# Patient Record
Sex: Female | Born: 1950 | Race: White | Hispanic: No | Marital: Married
Health system: Southern US, Community
[De-identification: ages and names within clinical notes are randomized; demographics above are authoritative.]

---

## 2000-08-12 ENCOUNTER — Encounter: Payer: Self-pay | Admitting: Family Medicine

## 2000-08-12 ENCOUNTER — Encounter: Admission: RE | Admit: 2000-08-12 | Discharge: 2000-08-12 | Payer: Self-pay | Admitting: Family Medicine

## 2000-08-21 ENCOUNTER — Ambulatory Visit (HOSPITAL_COMMUNITY): Admission: RE | Admit: 2000-08-21 | Discharge: 2000-08-21 | Payer: Self-pay | Admitting: Family Medicine

## 2000-08-21 ENCOUNTER — Encounter: Payer: Self-pay | Admitting: Family Medicine

## 2001-04-12 ENCOUNTER — Other Ambulatory Visit: Admission: RE | Admit: 2001-04-12 | Discharge: 2001-04-12 | Payer: Self-pay | Admitting: Gynecology

## 2002-04-18 ENCOUNTER — Other Ambulatory Visit: Admission: RE | Admit: 2002-04-18 | Discharge: 2002-04-18 | Payer: Self-pay | Admitting: Gynecology

## 2003-04-25 ENCOUNTER — Other Ambulatory Visit: Admission: RE | Admit: 2003-04-25 | Discharge: 2003-04-25 | Payer: Self-pay | Admitting: Gynecology

## 2004-01-08 ENCOUNTER — Ambulatory Visit (HOSPITAL_COMMUNITY): Admission: RE | Admit: 2004-01-08 | Discharge: 2004-01-08 | Payer: Self-pay | Admitting: Gynecology

## 2004-01-08 ENCOUNTER — Ambulatory Visit (HOSPITAL_BASED_OUTPATIENT_CLINIC_OR_DEPARTMENT_OTHER): Admission: RE | Admit: 2004-01-08 | Discharge: 2004-01-08 | Payer: Self-pay | Admitting: Gynecology

## 2004-05-22 ENCOUNTER — Other Ambulatory Visit: Admission: RE | Admit: 2004-05-22 | Discharge: 2004-05-22 | Payer: Self-pay | Admitting: Gynecology

## 2005-06-08 ENCOUNTER — Other Ambulatory Visit: Admission: RE | Admit: 2005-06-08 | Discharge: 2005-06-08 | Payer: Self-pay | Admitting: Gynecology

## 2005-06-30 ENCOUNTER — Encounter: Admission: RE | Admit: 2005-06-30 | Discharge: 2005-06-30 | Payer: Self-pay | Admitting: Family Medicine

## 2005-09-14 ENCOUNTER — Inpatient Hospital Stay (HOSPITAL_COMMUNITY): Admission: AD | Admit: 2005-09-14 | Discharge: 2005-09-15 | Payer: Self-pay | Admitting: Gynecology

## 2006-09-27 ENCOUNTER — Other Ambulatory Visit: Admission: RE | Admit: 2006-09-27 | Discharge: 2006-09-27 | Payer: Self-pay | Admitting: Gynecology

## 2011-02-26 ENCOUNTER — Other Ambulatory Visit: Payer: Self-pay | Admitting: Family Medicine

## 2011-02-26 DIAGNOSIS — R109 Unspecified abdominal pain: Secondary | ICD-10-CM

## 2011-02-27 ENCOUNTER — Other Ambulatory Visit: Payer: Self-pay

## 2011-02-27 ENCOUNTER — Ambulatory Visit
Admission: RE | Admit: 2011-02-27 | Discharge: 2011-02-27 | Disposition: A | Discharge status: Home / Self Care | Payer: BC Managed Care – PPO | Source: Ambulatory Visit | Attending: Family Medicine | Admitting: Family Medicine

## 2011-02-27 DIAGNOSIS — R109 Unspecified abdominal pain: Secondary | ICD-10-CM

## 2011-02-27 MED ORDER — IOHEXOL 300 MG/ML  SOLN: 100.0000 mL | Freq: Once | INTRAMUSCULAR | Status: DC | PRN

## 2011-02-27 MED ORDER — IOHEXOL 300 MG/ML  SOLN
100.0000 mL | Freq: Once | INTRAMUSCULAR | Status: AC | PRN
  Administered 2011-02-27: 100 mL via INTRAVENOUS

## 2011-08-13 ENCOUNTER — Other Ambulatory Visit: Payer: Self-pay | Admitting: Gynecology

## 2012-10-27 ENCOUNTER — Other Ambulatory Visit: Payer: Self-pay | Admitting: Gynecology

## 2014-09-26 ENCOUNTER — Other Ambulatory Visit: Payer: Self-pay | Admitting: Neurosurgery

## 2014-09-26 DIAGNOSIS — M5414 Radiculopathy, thoracic region: Secondary | ICD-10-CM

## 2014-10-26 ENCOUNTER — Ambulatory Visit
Admission: RE | Admit: 2014-10-26 | Discharge: 2014-10-26 | Disposition: A | Discharge status: Home / Self Care | Payer: BLUE CROSS/BLUE SHIELD | Source: Ambulatory Visit | Attending: Neurosurgery | Admitting: Neurosurgery

## 2014-10-26 DIAGNOSIS — M5414 Radiculopathy, thoracic region: Secondary | ICD-10-CM

## 2014-10-26 MED ORDER — MEPERIDINE HCL 100 MG/ML IJ SOLN
75.0000 mg | Freq: Once | INTRAMUSCULAR | Status: AC
  Administered 2014-10-26: 75 mg via INTRAMUSCULAR

## 2014-10-26 MED ORDER — ONDANSETRON HCL 4 MG/2ML IJ SOLN
4.0000 mg | Freq: Once | INTRAMUSCULAR | Status: AC
  Administered 2014-10-26: 4 mg via INTRAMUSCULAR

## 2014-10-26 MED ORDER — IOHEXOL 300 MG/ML  SOLN
10.0000 mL | Freq: Once | INTRAMUSCULAR | Status: AC | PRN
  Administered 2014-10-26: 10 mL via INTRATHECAL

## 2014-10-26 MED ORDER — ONDANSETRON HCL 4 MG/2ML IJ SOLN: 4.0000 mg | Freq: Four times a day (QID) | INTRAMUSCULAR | Status: DC | PRN

## 2014-10-26 MED ORDER — DIAZEPAM 5 MG PO TABS
5.0000 mg | ORAL_TABLET | Freq: Once | ORAL | Status: AC
  Administered 2014-10-26: 5 mg via ORAL

## 2014-10-26 NOTE — Discharge Instructions (Signed)

## 2015-12-23 DIAGNOSIS — R471 Dysarthria and anarthria: Secondary | ICD-10-CM | POA: Diagnosis not present

## 2015-12-23 DIAGNOSIS — L719 Rosacea, unspecified: Secondary | ICD-10-CM | POA: Diagnosis not present

## 2015-12-23 DIAGNOSIS — K219 Gastro-esophageal reflux disease without esophagitis: Secondary | ICD-10-CM | POA: Diagnosis not present

## 2015-12-23 DIAGNOSIS — L570 Actinic keratosis: Secondary | ICD-10-CM | POA: Diagnosis not present

## 2015-12-27 DIAGNOSIS — R1310 Dysphagia, unspecified: Secondary | ICD-10-CM | POA: Diagnosis not present

## 2015-12-27 DIAGNOSIS — K219 Gastro-esophageal reflux disease without esophagitis: Secondary | ICD-10-CM | POA: Diagnosis not present

## 2016-01-01 DIAGNOSIS — D235 Other benign neoplasm of skin of trunk: Secondary | ICD-10-CM | POA: Diagnosis not present

## 2016-01-01 DIAGNOSIS — D1801 Hemangioma of skin and subcutaneous tissue: Secondary | ICD-10-CM | POA: Diagnosis not present

## 2016-01-01 DIAGNOSIS — L814 Other melanin hyperpigmentation: Secondary | ICD-10-CM | POA: Diagnosis not present

## 2016-01-01 DIAGNOSIS — L570 Actinic keratosis: Secondary | ICD-10-CM | POA: Diagnosis not present

## 2016-01-01 DIAGNOSIS — L821 Other seborrheic keratosis: Secondary | ICD-10-CM | POA: Diagnosis not present

## 2016-02-04 ENCOUNTER — Other Ambulatory Visit: Payer: Self-pay | Admitting: Otolaryngology

## 2016-02-04 DIAGNOSIS — R1314 Dysphagia, pharyngoesophageal phase: Secondary | ICD-10-CM

## 2016-02-10 ENCOUNTER — Ambulatory Visit
Admission: RE | Admit: 2016-02-10 | Discharge: 2016-02-10 | Disposition: A | Discharge status: Home / Self Care | Payer: BLUE CROSS/BLUE SHIELD | Source: Ambulatory Visit | Attending: Otolaryngology | Admitting: Otolaryngology

## 2016-02-10 DIAGNOSIS — R1314 Dysphagia, pharyngoesophageal phase: Secondary | ICD-10-CM | POA: Diagnosis not present

## 2016-02-18 DIAGNOSIS — K219 Gastro-esophageal reflux disease without esophagitis: Secondary | ICD-10-CM | POA: Diagnosis not present

## 2016-03-04 DIAGNOSIS — E7800 Pure hypercholesterolemia, unspecified: Secondary | ICD-10-CM | POA: Diagnosis not present

## 2016-03-04 DIAGNOSIS — K219 Gastro-esophageal reflux disease without esophagitis: Secondary | ICD-10-CM | POA: Diagnosis not present

## 2016-03-04 DIAGNOSIS — I10 Essential (primary) hypertension: Secondary | ICD-10-CM | POA: Diagnosis not present

## 2016-03-04 DIAGNOSIS — Z0000 Encounter for general adult medical examination without abnormal findings: Secondary | ICD-10-CM | POA: Diagnosis not present

## 2016-03-25 DIAGNOSIS — M47812 Spondylosis without myelopathy or radiculopathy, cervical region: Secondary | ICD-10-CM | POA: Diagnosis not present

## 2016-06-12 DIAGNOSIS — Z23 Encounter for immunization: Secondary | ICD-10-CM | POA: Diagnosis not present

## 2016-06-16 DIAGNOSIS — K219 Gastro-esophageal reflux disease without esophagitis: Secondary | ICD-10-CM | POA: Diagnosis not present

## 2016-07-12 DIAGNOSIS — K5792 Diverticulitis of intestine, part unspecified, without perforation or abscess without bleeding: Secondary | ICD-10-CM | POA: Diagnosis not present

## 2016-07-15 DIAGNOSIS — K5792 Diverticulitis of intestine, part unspecified, without perforation or abscess without bleeding: Secondary | ICD-10-CM | POA: Diagnosis not present

## 2016-07-15 DIAGNOSIS — M545 Low back pain: Secondary | ICD-10-CM | POA: Diagnosis not present

## 2016-07-20 DIAGNOSIS — R12 Heartburn: Secondary | ICD-10-CM | POA: Diagnosis not present

## 2016-07-20 DIAGNOSIS — J040 Acute laryngitis: Secondary | ICD-10-CM | POA: Diagnosis not present

## 2016-07-20 DIAGNOSIS — F458 Other somatoform disorders: Secondary | ICD-10-CM | POA: Diagnosis not present

## 2016-07-24 ENCOUNTER — Other Ambulatory Visit: Payer: Self-pay | Admitting: Gastroenterology

## 2016-07-24 ENCOUNTER — Other Ambulatory Visit: Payer: Self-pay | Admitting: Family Medicine

## 2016-07-24 DIAGNOSIS — R109 Unspecified abdominal pain: Secondary | ICD-10-CM

## 2016-07-24 DIAGNOSIS — R471 Dysarthria and anarthria: Secondary | ICD-10-CM

## 2016-07-24 DIAGNOSIS — K219 Gastro-esophageal reflux disease without esophagitis: Secondary | ICD-10-CM

## 2016-07-29 ENCOUNTER — Other Ambulatory Visit: Payer: BLUE CROSS/BLUE SHIELD

## 2016-08-03 ENCOUNTER — Ambulatory Visit
Admission: RE | Admit: 2016-08-03 | Discharge: 2016-08-03 | Disposition: A | Discharge status: Home / Self Care | Payer: BLUE CROSS/BLUE SHIELD | Source: Ambulatory Visit | Attending: Gastroenterology | Admitting: Gastroenterology

## 2016-08-03 DIAGNOSIS — K5730 Diverticulosis of large intestine without perforation or abscess without bleeding: Secondary | ICD-10-CM | POA: Diagnosis not present

## 2016-08-03 DIAGNOSIS — R109 Unspecified abdominal pain: Secondary | ICD-10-CM

## 2016-08-03 MED ORDER — IOPAMIDOL (ISOVUE-300) INJECTION 61%
100.0000 mL | Freq: Once | INTRAVENOUS | Status: AC | PRN
  Administered 2016-08-03: 100 mL via INTRAVENOUS

## 2016-08-06 ENCOUNTER — Ambulatory Visit
Admission: RE | Admit: 2016-08-06 | Discharge: 2016-08-06 | Disposition: A | Discharge status: Home / Self Care | Payer: BLUE CROSS/BLUE SHIELD | Source: Ambulatory Visit | Attending: Family Medicine | Admitting: Family Medicine

## 2016-08-06 DIAGNOSIS — R471 Dysarthria and anarthria: Secondary | ICD-10-CM

## 2016-08-06 DIAGNOSIS — K219 Gastro-esophageal reflux disease without esophagitis: Secondary | ICD-10-CM

## 2016-08-06 DIAGNOSIS — R1310 Dysphagia, unspecified: Secondary | ICD-10-CM | POA: Diagnosis not present

## 2016-08-10 DIAGNOSIS — K219 Gastro-esophageal reflux disease without esophagitis: Secondary | ICD-10-CM | POA: Diagnosis not present

## 2016-09-09 DIAGNOSIS — E7800 Pure hypercholesterolemia, unspecified: Secondary | ICD-10-CM | POA: Diagnosis not present

## 2016-09-09 DIAGNOSIS — K219 Gastro-esophageal reflux disease without esophagitis: Secondary | ICD-10-CM | POA: Diagnosis not present

## 2016-09-09 DIAGNOSIS — E041 Nontoxic single thyroid nodule: Secondary | ICD-10-CM | POA: Diagnosis not present

## 2016-09-09 DIAGNOSIS — I10 Essential (primary) hypertension: Secondary | ICD-10-CM | POA: Diagnosis not present

## 2016-09-30 DIAGNOSIS — Z1231 Encounter for screening mammogram for malignant neoplasm of breast: Secondary | ICD-10-CM | POA: Diagnosis not present

## 2017-02-04 DIAGNOSIS — Z1211 Encounter for screening for malignant neoplasm of colon: Secondary | ICD-10-CM | POA: Diagnosis not present

## 2017-02-04 DIAGNOSIS — Z01818 Encounter for other preprocedural examination: Secondary | ICD-10-CM | POA: Diagnosis not present

## 2017-03-18 DIAGNOSIS — D126 Benign neoplasm of colon, unspecified: Secondary | ICD-10-CM | POA: Diagnosis not present

## 2017-03-18 DIAGNOSIS — Z1211 Encounter for screening for malignant neoplasm of colon: Secondary | ICD-10-CM | POA: Diagnosis not present

## 2017-03-18 DIAGNOSIS — K5730 Diverticulosis of large intestine without perforation or abscess without bleeding: Secondary | ICD-10-CM | POA: Diagnosis not present

## 2017-03-19 DIAGNOSIS — Z1211 Encounter for screening for malignant neoplasm of colon: Secondary | ICD-10-CM | POA: Diagnosis not present

## 2017-03-19 DIAGNOSIS — D126 Benign neoplasm of colon, unspecified: Secondary | ICD-10-CM | POA: Diagnosis not present

## 2017-04-07 DIAGNOSIS — L918 Other hypertrophic disorders of the skin: Secondary | ICD-10-CM | POA: Diagnosis not present

## 2017-04-07 DIAGNOSIS — L570 Actinic keratosis: Secondary | ICD-10-CM | POA: Diagnosis not present

## 2017-04-07 DIAGNOSIS — D1801 Hemangioma of skin and subcutaneous tissue: Secondary | ICD-10-CM | POA: Diagnosis not present

## 2017-04-07 DIAGNOSIS — D225 Melanocytic nevi of trunk: Secondary | ICD-10-CM | POA: Diagnosis not present

## 2017-04-07 DIAGNOSIS — L821 Other seborrheic keratosis: Secondary | ICD-10-CM | POA: Diagnosis not present

## 2017-04-22 ENCOUNTER — Other Ambulatory Visit: Payer: Self-pay | Admitting: Family Medicine

## 2017-04-22 DIAGNOSIS — I10 Essential (primary) hypertension: Secondary | ICD-10-CM | POA: Diagnosis not present

## 2017-04-22 DIAGNOSIS — E041 Nontoxic single thyroid nodule: Secondary | ICD-10-CM

## 2017-04-22 DIAGNOSIS — Z0000 Encounter for general adult medical examination without abnormal findings: Secondary | ICD-10-CM | POA: Diagnosis not present

## 2017-04-22 DIAGNOSIS — Z23 Encounter for immunization: Secondary | ICD-10-CM | POA: Diagnosis not present

## 2017-04-26 ENCOUNTER — Ambulatory Visit
Admission: RE | Admit: 2017-04-26 | Discharge: 2017-04-26 | Disposition: A | Discharge status: Home / Self Care | Payer: BLUE CROSS/BLUE SHIELD | Source: Ambulatory Visit | Attending: Family Medicine | Admitting: Family Medicine

## 2017-04-26 DIAGNOSIS — E041 Nontoxic single thyroid nodule: Secondary | ICD-10-CM

## 2017-06-01 DIAGNOSIS — H2513 Age-related nuclear cataract, bilateral: Secondary | ICD-10-CM | POA: Diagnosis not present

## 2017-06-01 DIAGNOSIS — H524 Presbyopia: Secondary | ICD-10-CM | POA: Diagnosis not present

## 2017-06-23 DIAGNOSIS — Z23 Encounter for immunization: Secondary | ICD-10-CM | POA: Diagnosis not present

## 2017-09-01 ENCOUNTER — Other Ambulatory Visit: Payer: Self-pay | Admitting: Family Medicine

## 2017-09-01 DIAGNOSIS — E041 Nontoxic single thyroid nodule: Secondary | ICD-10-CM

## 2017-09-01 DIAGNOSIS — R1310 Dysphagia, unspecified: Secondary | ICD-10-CM | POA: Diagnosis not present

## 2017-09-03 ENCOUNTER — Ambulatory Visit
Admission: RE | Admit: 2017-09-03 | Discharge: 2017-09-03 | Disposition: A | Discharge status: Home / Self Care | Payer: BLUE CROSS/BLUE SHIELD | Source: Ambulatory Visit | Attending: Family Medicine | Admitting: Family Medicine

## 2017-09-03 DIAGNOSIS — E041 Nontoxic single thyroid nodule: Secondary | ICD-10-CM

## 2017-11-02 DIAGNOSIS — Z1231 Encounter for screening mammogram for malignant neoplasm of breast: Secondary | ICD-10-CM | POA: Diagnosis not present

## 2017-11-08 DIAGNOSIS — H04123 Dry eye syndrome of bilateral lacrimal glands: Secondary | ICD-10-CM | POA: Diagnosis not present

## 2017-11-18 DIAGNOSIS — K219 Gastro-esophageal reflux disease without esophagitis: Secondary | ICD-10-CM | POA: Diagnosis not present

## 2018-02-24 DIAGNOSIS — H04123 Dry eye syndrome of bilateral lacrimal glands: Secondary | ICD-10-CM | POA: Diagnosis not present

## 2018-03-16 DIAGNOSIS — M47816 Spondylosis without myelopathy or radiculopathy, lumbar region: Secondary | ICD-10-CM | POA: Diagnosis not present

## 2018-03-16 DIAGNOSIS — M25551 Pain in right hip: Secondary | ICD-10-CM | POA: Diagnosis not present

## 2018-03-16 DIAGNOSIS — M170 Bilateral primary osteoarthritis of knee: Secondary | ICD-10-CM | POA: Diagnosis not present

## 2018-03-16 DIAGNOSIS — M25552 Pain in left hip: Secondary | ICD-10-CM | POA: Diagnosis not present

## 2018-03-30 DIAGNOSIS — M170 Bilateral primary osteoarthritis of knee: Secondary | ICD-10-CM | POA: Diagnosis not present

## 2018-04-02 DIAGNOSIS — M545 Low back pain: Secondary | ICD-10-CM | POA: Diagnosis not present

## 2018-04-07 DIAGNOSIS — H04123 Dry eye syndrome of bilateral lacrimal glands: Secondary | ICD-10-CM | POA: Diagnosis not present

## 2018-04-28 DIAGNOSIS — M545 Low back pain: Secondary | ICD-10-CM | POA: Diagnosis not present

## 2018-06-22 DIAGNOSIS — E7800 Pure hypercholesterolemia, unspecified: Secondary | ICD-10-CM | POA: Diagnosis not present

## 2018-06-22 DIAGNOSIS — Z0000 Encounter for general adult medical examination without abnormal findings: Secondary | ICD-10-CM | POA: Diagnosis not present

## 2018-06-22 DIAGNOSIS — G479 Sleep disorder, unspecified: Secondary | ICD-10-CM | POA: Diagnosis not present

## 2018-06-22 DIAGNOSIS — Z23 Encounter for immunization: Secondary | ICD-10-CM | POA: Diagnosis not present

## 2018-06-22 DIAGNOSIS — I10 Essential (primary) hypertension: Secondary | ICD-10-CM | POA: Diagnosis not present

## 2018-06-22 DIAGNOSIS — K219 Gastro-esophageal reflux disease without esophagitis: Secondary | ICD-10-CM | POA: Diagnosis not present

## 2018-08-09 DIAGNOSIS — E875 Hyperkalemia: Secondary | ICD-10-CM | POA: Diagnosis not present

## 2018-08-28 IMAGING — CT CT ABD-PELV W/ CM
2 of 5 series · 16 of 46 positions shown, 18 images · IV contrast (APPLIED)
Comparison: 02/27/2011

CLINICAL DATA: Left side abdominal pain for 1 month.  Weight loss.

EXAM:
CT ABDOMEN AND PELVIS WITH CONTRAST
TECHNIQUE: Multidetector CT imaging of the abdomen and pelvis was performed
using the standard protocol following bolus administration of
intravenous contrast.
CONTRAST:  100mL PJRMWV-6TT IOPAMIDOL (PJRMWV-6TT) INJECTION 61%

[Series 2: abd/pelvis w/cm · axial · 0.69mm/px · z∈[+682,+1092]mm · 13 of 94 slices shown, 15 images]
[im 6/94  soft-tissue]
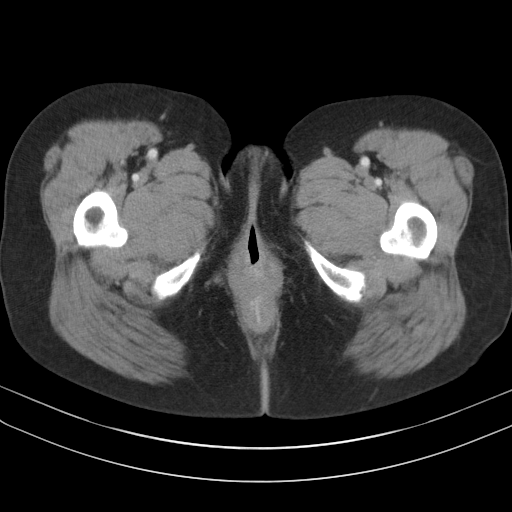
[im 6/94  bone]
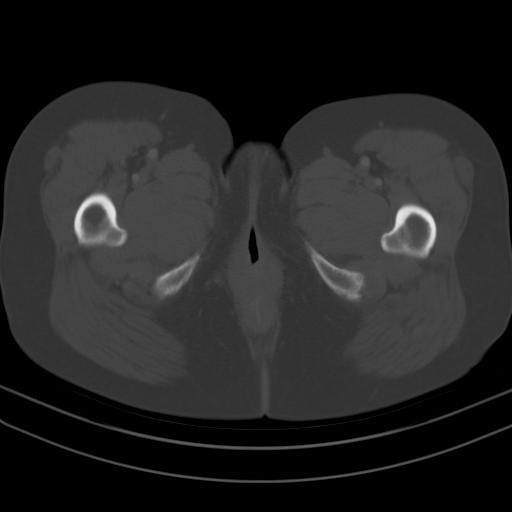
[im 11/94  soft-tissue]
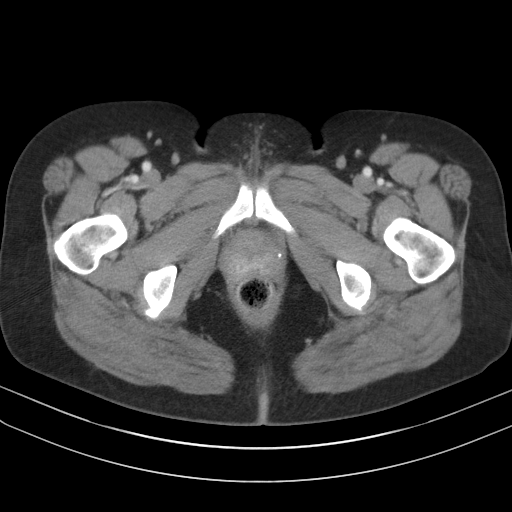
[im 21/94  soft-tissue]
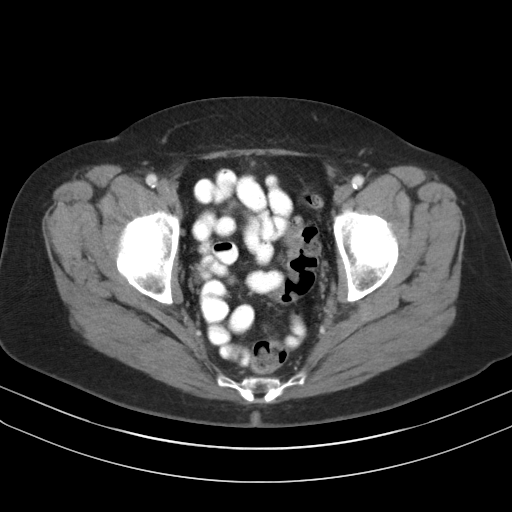
[im 26/94  soft-tissue]
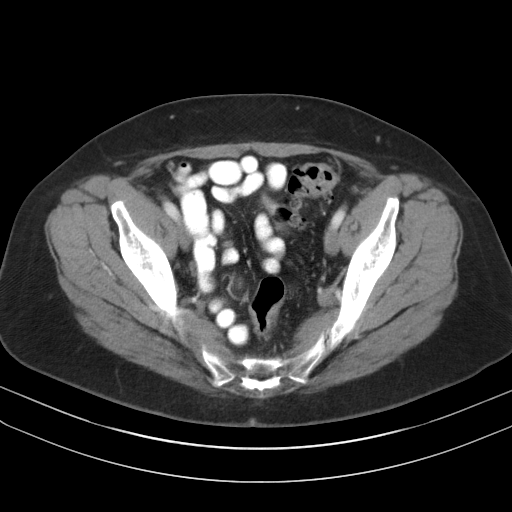
[im 32/94  soft-tissue]
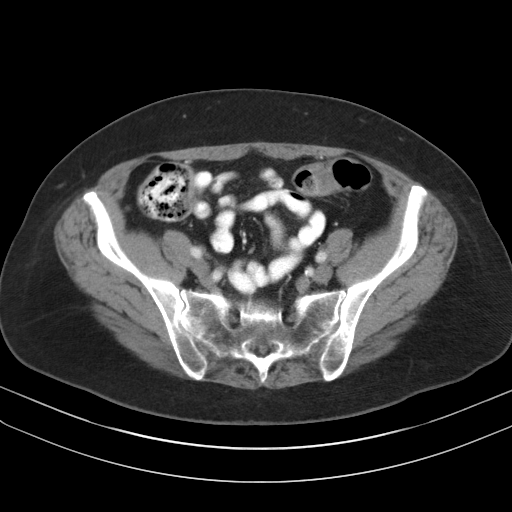
[im 42/94  soft-tissue]
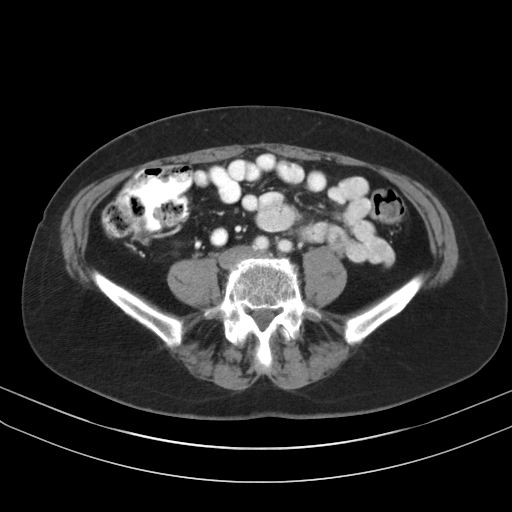
[im 47/94  soft-tissue]
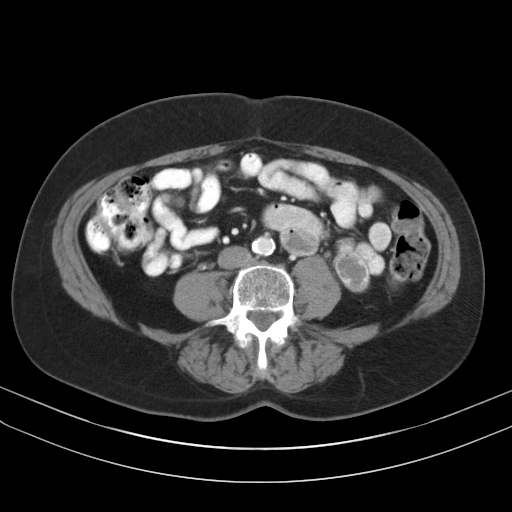
[im 52/94  soft-tissue]
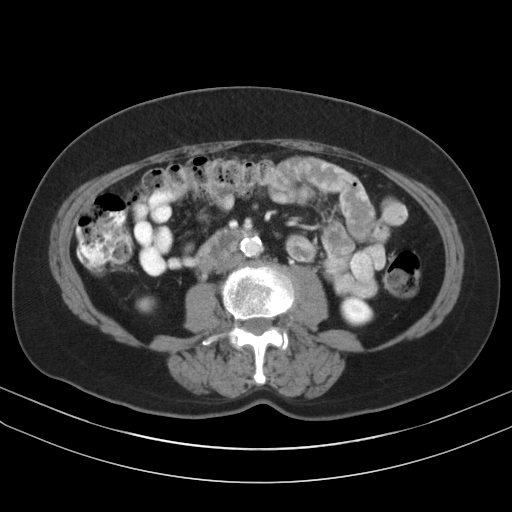
[im 63/94  soft-tissue]
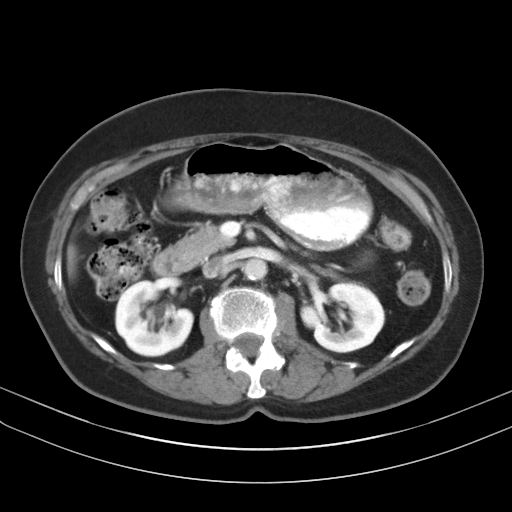
[im 63/94  bone]
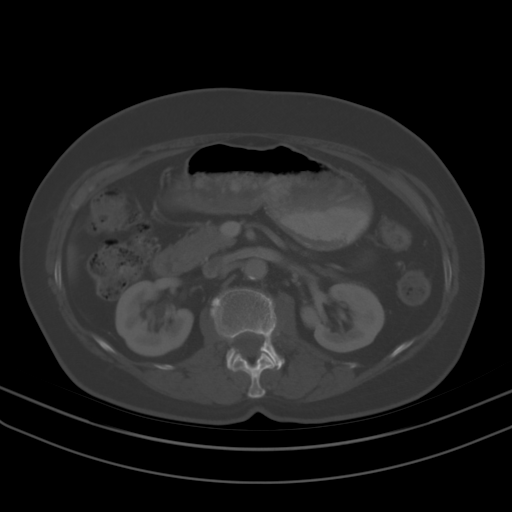
[im 68/94  soft-tissue]
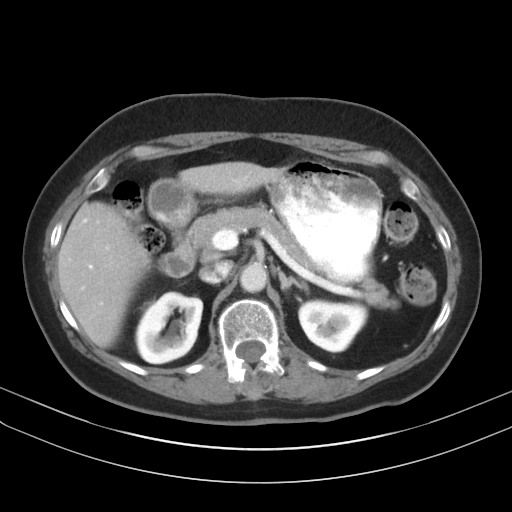
[im 73/94  soft-tissue]
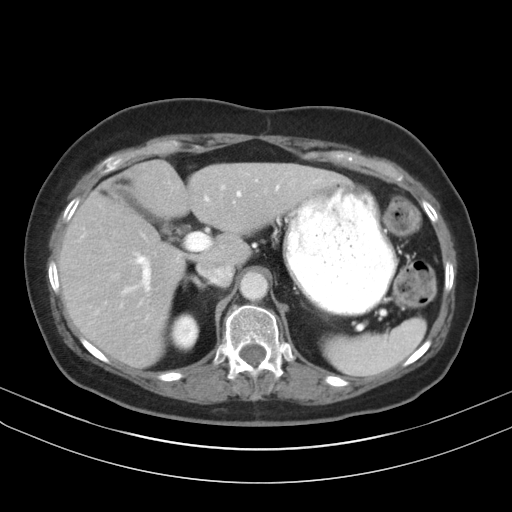
[im 83/94  soft-tissue]
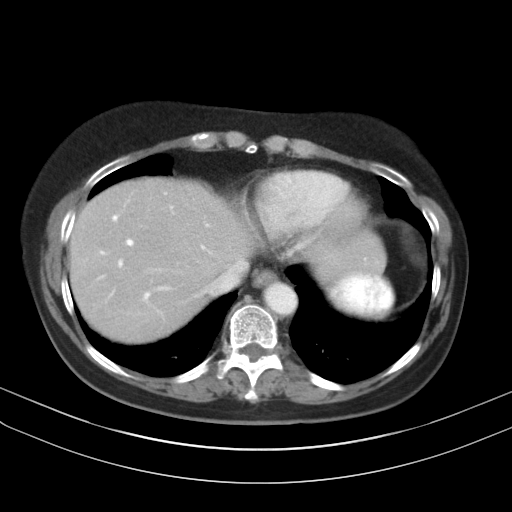
[im 88/94  soft-tissue]
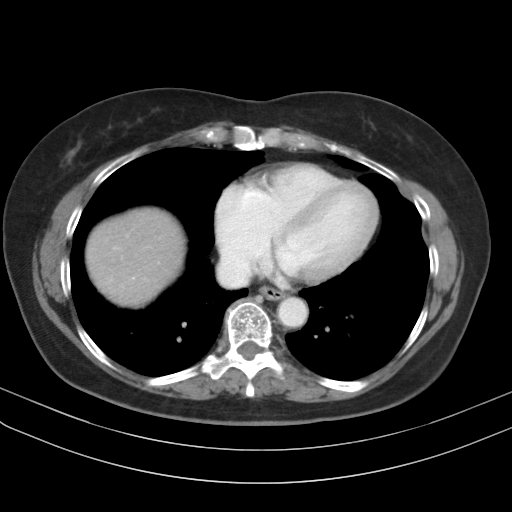

[Series 3: cor · coronal · 0.66mm/px · 3 of 76 slices shown]
[im 26/76  soft-tissue]
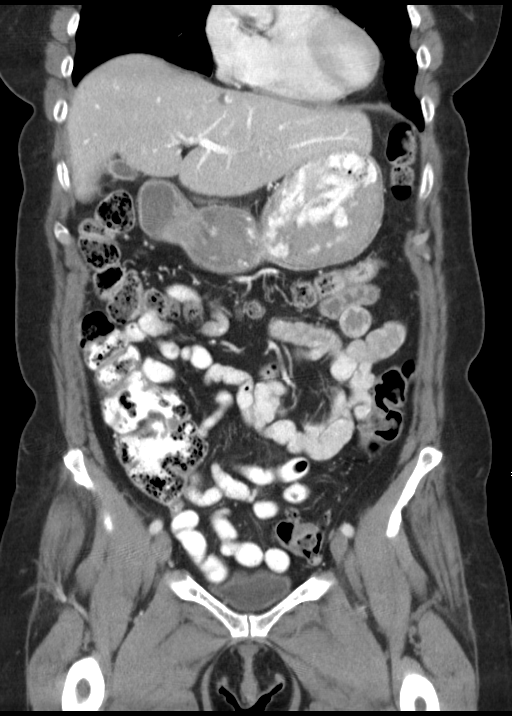
[im 34/76  soft-tissue]
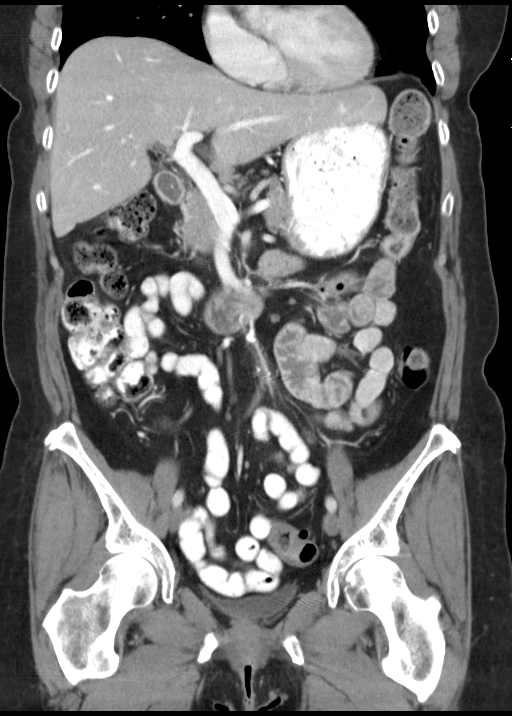
[im 42/76  soft-tissue]
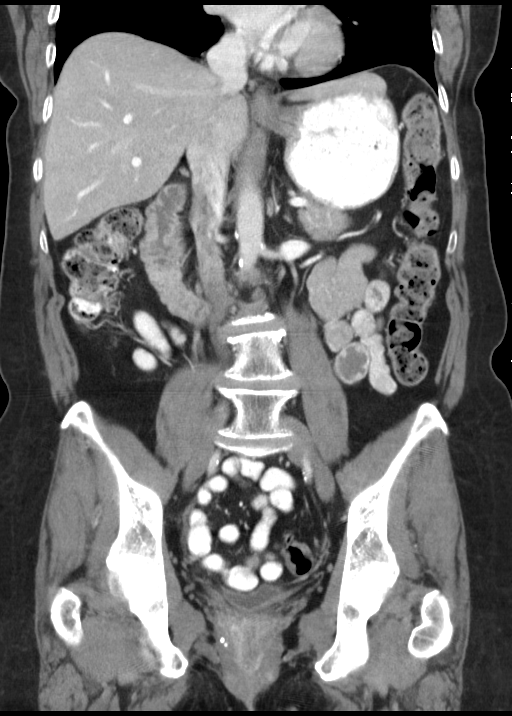

[16 of 46 positions shown; findings below may reference images not displayed]

FINDINGS: Lower chest: Areas of atelectasis or scarring in the lung bases.
Heart is normal size. No effusions.

Hepatobiliary: Probable hemangioma in the posterior right hepatic
lobe, approximately 1.7 cm, not significantly changed since prior
study. Small hypodensity in the left hepatic dome, likely small
cysts. No biliary duct dilatation. Gallbladder contracted, grossly
unremarkable.

Pancreas: No focal abnormality or ductal dilatation.

Spleen: No focal abnormality.  Normal size.

Adrenals/Urinary Tract: No adrenal abnormality. No focal renal
abnormality. No stones or hydronephrosis. Urinary bladder is
unremarkable.

Stomach/Bowel: Sigmoid diverticulosis. No active diverticulitis.
Stomach, large and small bowel grossly unremarkable.

Vascular/Lymphatic: Diffuse aortic calcifications and scattered
iliac calcifications. No aneurysm. There are small right lower
quadrant mesenteric lymph nodes, none pathologically enlarged. These
are stable since prior study. No adenopathy.

Reproductive: Prior hysterectomy.  No adnexal mass.

Other: No free fluid or free air.

Musculoskeletal: Degenerative disc disease in the lumbar spine. No
acute bony abnormality.
IMPRESSION: No acute findings in the abdomen or pelvis.

Sigmoid diverticulosis.

Stable hemangioma in the posterior right hepatic lobe.

Aortoiliac atherosclerosis.

## 2018-08-31 IMAGING — US US THYROID
1 series · 13 of 25 positions shown · non-contrast
Comparison: None.

CLINICAL DATA: Other.  Knot in throat.  Dysarthria.  Dysphagia.

EXAM:
THYROID ULTRASOUND
TECHNIQUE: Ultrasound examination of the thyroid gland and adjacent soft
tissues was performed.

[Series 1: us thyroid · 0.08mm/px · 13 of 53 slices shown]
[im 1/53]
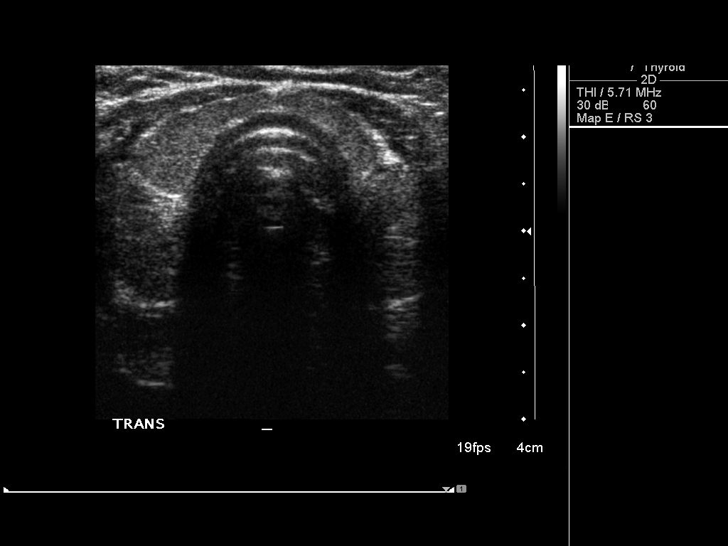
[im 5/53]
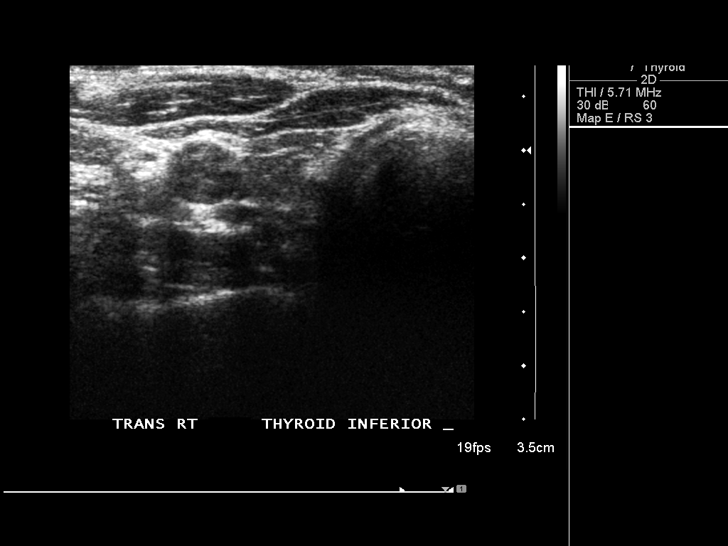
[im 9/53]
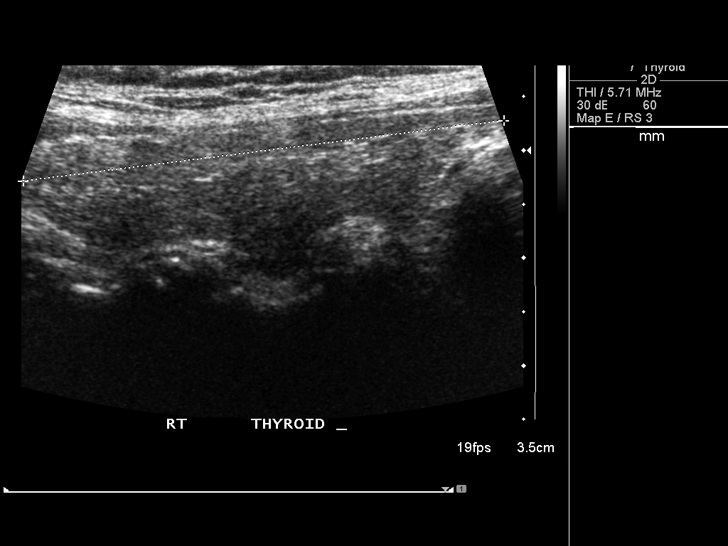
[im 14/53]
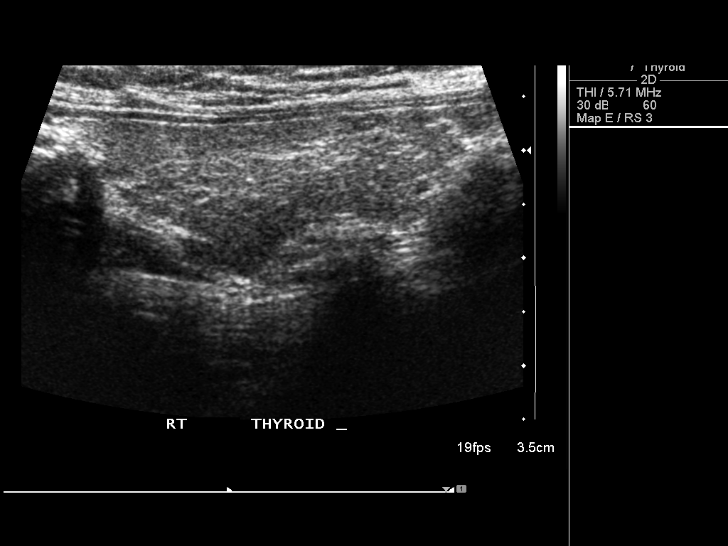
[im 18/53]
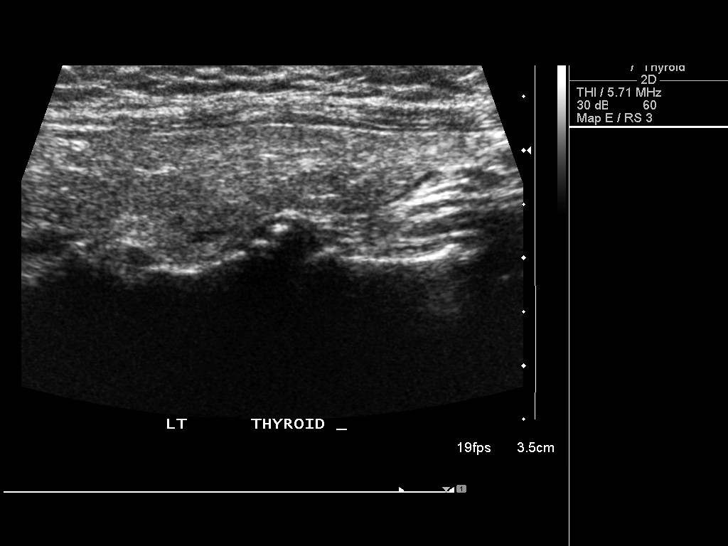
[im 22/53]
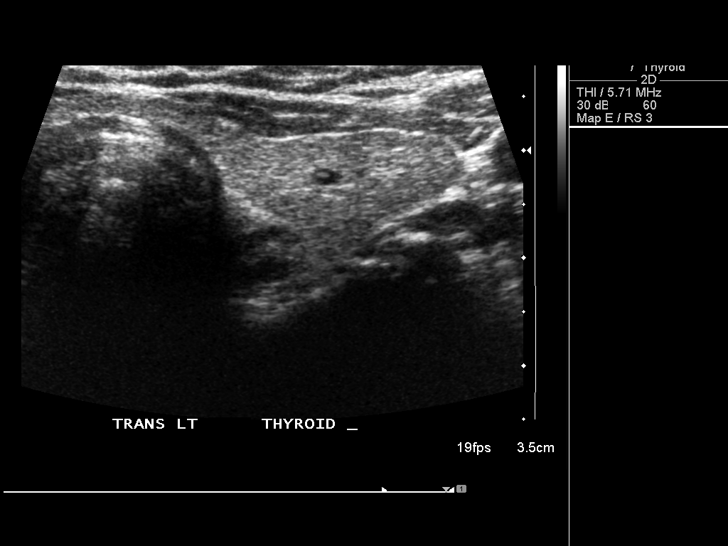
[im 27/53]
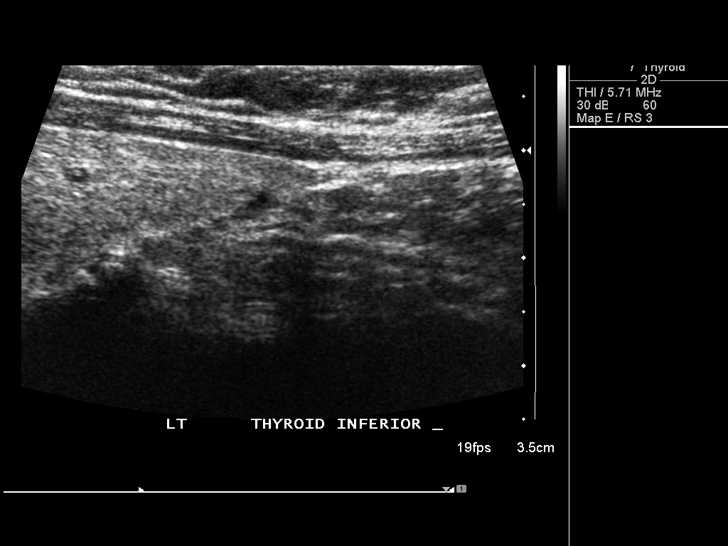
[im 31/53]
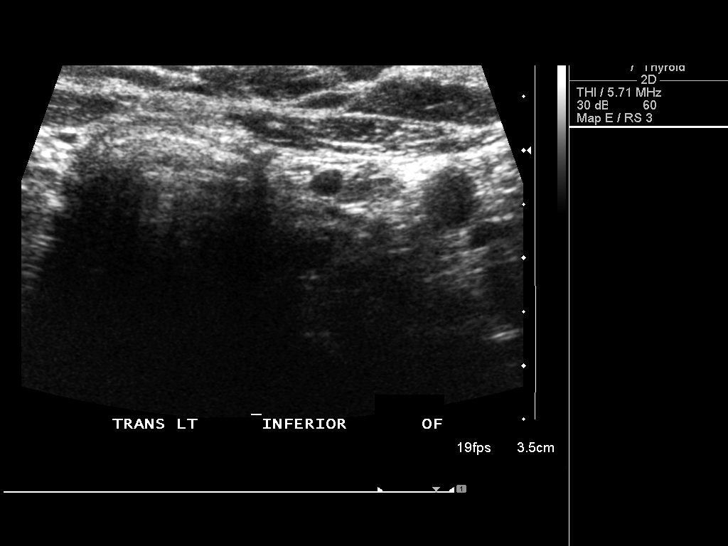
[im 35/53]
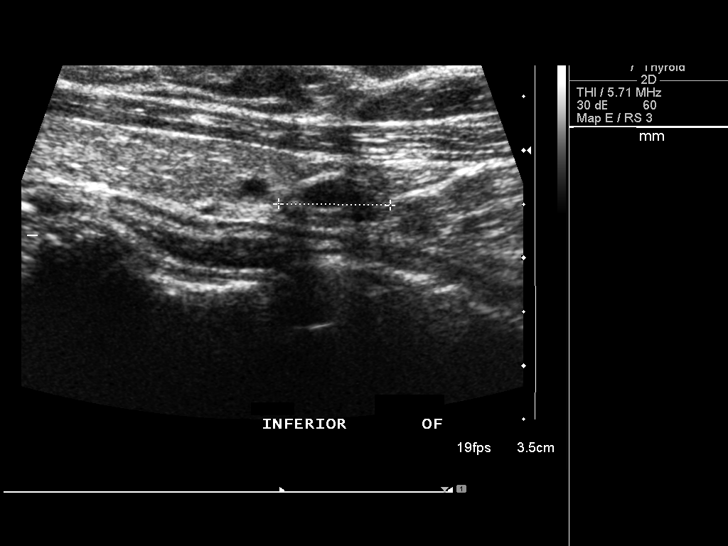
[im 40/53]
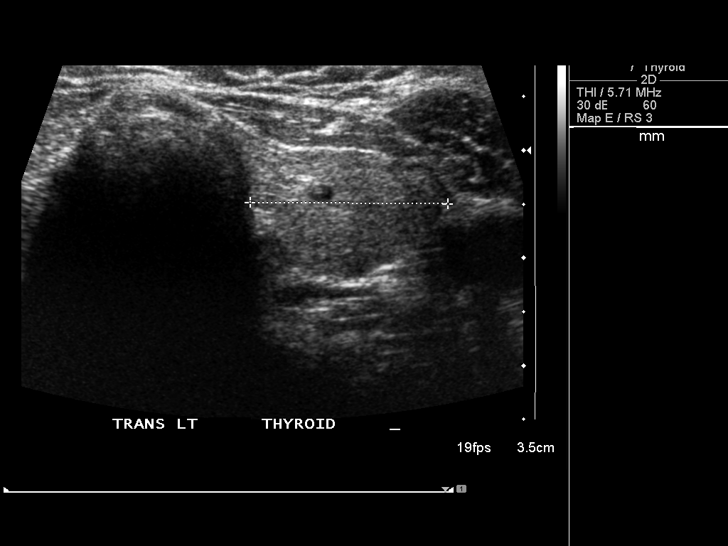
[im 44/53]
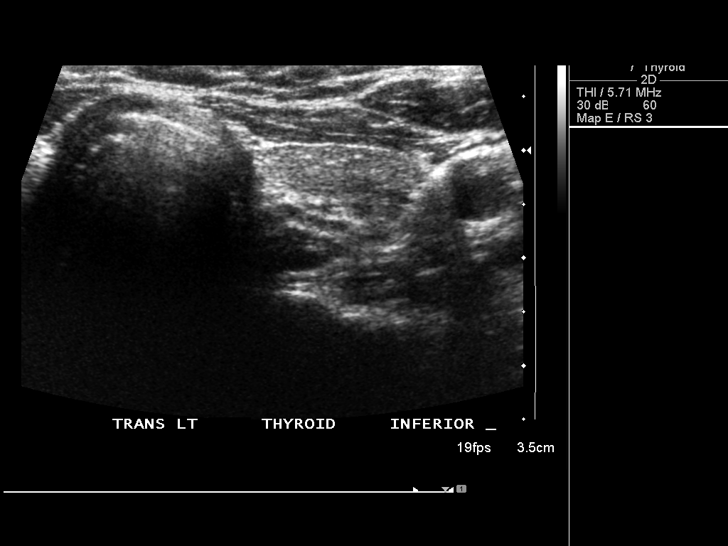
[im 48/53]
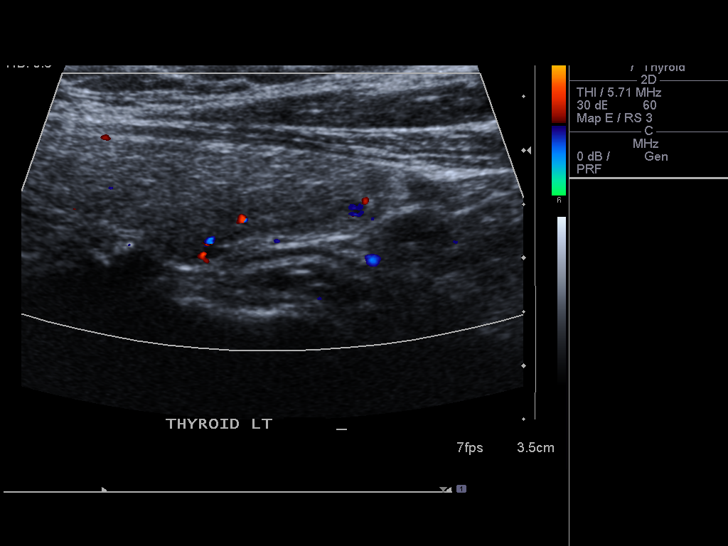
[im 53/53]
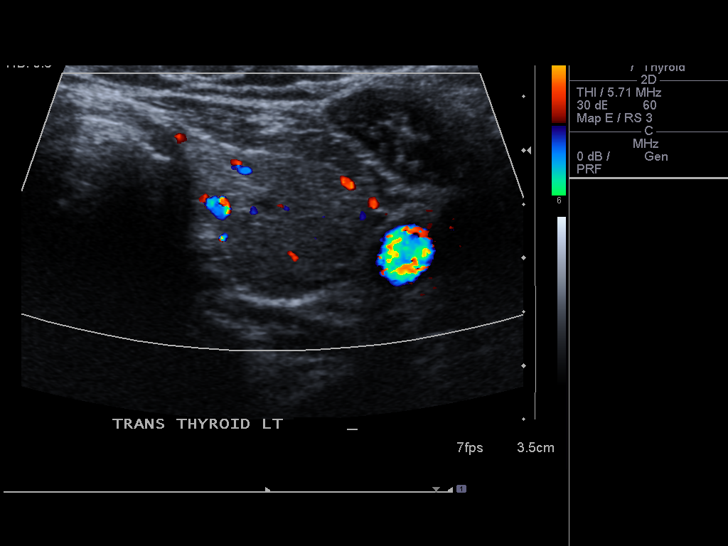

[13 of 25 positions shown; findings below may reference images not displayed]

FINDINGS: Parenchymal Echotexture: Moderately heterogenous

Estimated total number of nodules >/= 1 cm: 0

Number of spongiform nodules >/=  2 cm not described below (TR1): 0

Number of mixed cystic and solid nodules >/= 1.5 cm not described
below (TR2): 0

_________________________________________________________

Isthmus: Normal in size measures 0.3 cm in diameter

No discrete nodules are identified within the thyroid isthmus.

_________________________________________________________

Right lobe: Normal in size measuring 4.5 x 1.4 x 1.6 cm

No discrete nodules are identified within the right lobe of the
thyroid.

_________________________________________________________

Left lobe: Normal in size measuring 4.6 x 1.5 x 2.1 cm

There is a punctate (approximately 0.3 cm) anechoic cyst which
contains a punctate internal echogenic foci compatible with colloid
within the left lobe of the thyroid which is not meet imaging
criteria to recommend percutaneous sampling or dedicated follow-up.

The patient's palpable area of concern within the left side of the
neck correlates with an approximately 0.8 x 0.4 x 1.0 cm mixed
echogenic, partially cystic, partially solid nodule which appears to
be either adjacent to or arise exophytically from the inferior
aspect the left lobe of the thyroid.
IMPRESSION: Patient's palpable area of concern within the left side of the neck
appears to correlate with a punctate (approximately 1 cm) partially
cystic, partially solid nodule which appears to be either adjacent
to or arise exophytically from the inferior aspect of the left lobe
of the thyroid and does not meet imaging criteria to recommend
percutaneous sampling or dedicated follow-up.

The above is in keeping with the ACR TI-RADS recommendations - [HOSPITAL] 9488;[DATE].

## 2018-10-10 DIAGNOSIS — H04123 Dry eye syndrome of bilateral lacrimal glands: Secondary | ICD-10-CM | POA: Diagnosis not present

## 2018-10-10 DIAGNOSIS — H5203 Hypermetropia, bilateral: Secondary | ICD-10-CM | POA: Diagnosis not present

## 2018-10-10 DIAGNOSIS — H2513 Age-related nuclear cataract, bilateral: Secondary | ICD-10-CM | POA: Diagnosis not present

## 2018-10-12 DIAGNOSIS — D229 Melanocytic nevi, unspecified: Secondary | ICD-10-CM | POA: Diagnosis not present

## 2018-10-12 DIAGNOSIS — D485 Neoplasm of uncertain behavior of skin: Secondary | ICD-10-CM | POA: Diagnosis not present

## 2018-10-12 DIAGNOSIS — L814 Other melanin hyperpigmentation: Secondary | ICD-10-CM | POA: Diagnosis not present

## 2018-10-12 DIAGNOSIS — L821 Other seborrheic keratosis: Secondary | ICD-10-CM | POA: Diagnosis not present

## 2018-10-12 DIAGNOSIS — D1801 Hemangioma of skin and subcutaneous tissue: Secondary | ICD-10-CM | POA: Diagnosis not present

## 2018-10-13 DIAGNOSIS — L989 Disorder of the skin and subcutaneous tissue, unspecified: Secondary | ICD-10-CM | POA: Diagnosis not present

## 2018-10-23 DIAGNOSIS — B029 Zoster without complications: Secondary | ICD-10-CM | POA: Diagnosis not present

## 2018-10-23 DIAGNOSIS — R079 Chest pain, unspecified: Secondary | ICD-10-CM | POA: Diagnosis not present

## 2018-11-01 DIAGNOSIS — B0223 Postherpetic polyneuropathy: Secondary | ICD-10-CM | POA: Diagnosis not present

## 2019-04-18 DIAGNOSIS — T7840XA Allergy, unspecified, initial encounter: Secondary | ICD-10-CM | POA: Diagnosis not present

## 2019-04-18 DIAGNOSIS — R42 Dizziness and giddiness: Secondary | ICD-10-CM | POA: Diagnosis not present

## 2019-06-26 DIAGNOSIS — Z0000 Encounter for general adult medical examination without abnormal findings: Secondary | ICD-10-CM | POA: Diagnosis not present

## 2019-10-13 DIAGNOSIS — Z1231 Encounter for screening mammogram for malignant neoplasm of breast: Secondary | ICD-10-CM | POA: Diagnosis not present

## 2019-10-13 DIAGNOSIS — I10 Essential (primary) hypertension: Secondary | ICD-10-CM | POA: Diagnosis not present

## 2019-10-13 DIAGNOSIS — K219 Gastro-esophageal reflux disease without esophagitis: Secondary | ICD-10-CM | POA: Diagnosis not present

## 2019-10-13 DIAGNOSIS — H2513 Age-related nuclear cataract, bilateral: Secondary | ICD-10-CM | POA: Diagnosis not present

## 2019-10-13 DIAGNOSIS — E041 Nontoxic single thyroid nodule: Secondary | ICD-10-CM | POA: Diagnosis not present

## 2019-10-13 DIAGNOSIS — H04123 Dry eye syndrome of bilateral lacrimal glands: Secondary | ICD-10-CM | POA: Diagnosis not present

## 2019-10-13 DIAGNOSIS — E7800 Pure hypercholesterolemia, unspecified: Secondary | ICD-10-CM | POA: Diagnosis not present

## 2019-10-13 DIAGNOSIS — H524 Presbyopia: Secondary | ICD-10-CM | POA: Diagnosis not present

## 2019-11-10 DIAGNOSIS — Z23 Encounter for immunization: Secondary | ICD-10-CM | POA: Diagnosis not present

## 2019-11-20 DIAGNOSIS — L814 Other melanin hyperpigmentation: Secondary | ICD-10-CM | POA: Diagnosis not present

## 2019-11-20 DIAGNOSIS — L918 Other hypertrophic disorders of the skin: Secondary | ICD-10-CM | POA: Diagnosis not present

## 2019-11-20 DIAGNOSIS — D2361 Other benign neoplasm of skin of right upper limb, including shoulder: Secondary | ICD-10-CM | POA: Diagnosis not present

## 2019-11-20 DIAGNOSIS — L821 Other seborrheic keratosis: Secondary | ICD-10-CM | POA: Diagnosis not present

## 2019-11-20 DIAGNOSIS — L570 Actinic keratosis: Secondary | ICD-10-CM | POA: Diagnosis not present

## 2019-12-07 DIAGNOSIS — Z23 Encounter for immunization: Secondary | ICD-10-CM | POA: Diagnosis not present

## 2021-04-29 ENCOUNTER — Other Ambulatory Visit: Payer: Self-pay

## 2021-04-29 ENCOUNTER — Ambulatory Visit
Admission: RE | Admit: 2021-04-29 | Discharge: 2021-04-29 | Disposition: A | Discharge status: Home / Self Care | Payer: Medicare PPO | Source: Ambulatory Visit | Attending: Physician Assistant | Admitting: Physician Assistant

## 2021-04-29 ENCOUNTER — Other Ambulatory Visit: Payer: Self-pay | Admitting: Physician Assistant

## 2021-04-29 DIAGNOSIS — R198 Other specified symptoms and signs involving the digestive system and abdomen: Secondary | ICD-10-CM

## 2021-04-29 DIAGNOSIS — K5900 Constipation, unspecified: Secondary | ICD-10-CM
